# Patient Record
Sex: Female | Born: 1965 | Race: White | Hispanic: No | Marital: Married | Smoking: Never smoker
Health system: Southern US, Community
[De-identification: ages and names within clinical notes are randomized; demographics above are authoritative.]

---

## 2012-04-09 DIAGNOSIS — F419 Anxiety disorder, unspecified: Secondary | ICD-10-CM | POA: Insufficient documentation

## 2014-08-29 DIAGNOSIS — G4700 Insomnia, unspecified: Secondary | ICD-10-CM | POA: Insufficient documentation

## 2014-09-12 DIAGNOSIS — F325 Major depressive disorder, single episode, in full remission: Secondary | ICD-10-CM | POA: Insufficient documentation

## 2015-08-21 ENCOUNTER — Ambulatory Visit (INDEPENDENT_AMBULATORY_CARE_PROVIDER_SITE_OTHER): Payer: BC Managed Care – PPO | Admitting: Family Medicine

## 2015-08-21 ENCOUNTER — Encounter: Payer: Self-pay | Admitting: Family Medicine

## 2015-08-21 VITALS — BP 110/75 | HR 68 | Ht 69.0 in | Wt 192.0 lb

## 2015-08-21 DIAGNOSIS — M25511 Pain in right shoulder: Secondary | ICD-10-CM | POA: Insufficient documentation

## 2015-08-21 NOTE — Progress Notes (Signed)
PCP: Dr. Victorino DikeJennifer Shah  Subjective:   HPI: Patient is a 50 y.o. female here for right shoulder pain.  Patient reports for past 2 months she's had right shoulder pain. Pain is lateral, sharp and worse when lying on this side. Pain currently 0/10. 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 No prior issues. Right handed. No skin changes, numbness.  No past medical history on file.  No current outpatient prescriptions on file prior to visit.   No current facility-administered medications on file prior to visit.    No past surgical history on file.  No Known Allergies  Social History   Social History  . Marital Status: Married    Spouse Name: N/A  . Number of Children: N/A  . Years of Education: N/A   Occupational History  . Not on file.   Social History Main Topics  . Smoking status: Never Smoker   . Smokeless tobacco: Not on file  . Alcohol Use: Not on file  . Drug Use: Not on file  . Sexual Activity: Not on file   Other Topics Concern  . Not on file   Social History Narrative  . No narrative on file    No family history on file.  BP 110/75 mmHg  Pulse 68  Ht 5\' 9"  (1.753 m)  Wt 192 lb (87.091 kg)  BMI 28.34 kg/m2  Review of Systems: See HPI above.    Objective:  Physical Exam:  Gen: NAD, comfortable in exam room  Right shoulder: No swelling, ecchymoses.  No gross deformity. No TTP. FROM with painful arc. Positive Hawkins, negative Neers. Negative Speeds, Yergasons. Strength 5/5 with empty can and resisted internal/external rotation.  Very mild pain with empty can and ER Negative apprehension. NV intact distally.  Left shoulder: FROM without pain.    Assessment & Plan:  1. Right shoulder pain - 2/2 rotator cuff impingement.  Discussed nsaids.  Shown home exercises to do daily.  Consider injection, physical therapy, nitro patches, imaging if not improving.  F/u in 6 weeks.

## 2015-08-21 NOTE — Patient Instructions (Signed)
You have rotator cuff impingement Try to avoid painful activities (overhead activities, lifting with extended arm) as much as possible. Aleve 2 tabs twice a day with food OR ibuprofen 3 tabs three times a day with food for pain and inflammation for 7-10 days then as needed. Can take tylenol in addition to this. Subacromial injection may be beneficial to help with pain and to decrease inflammation. Consider physical therapy with transition to home exercise program. Do home exercise program with theraband and scapular stabilization exercises daily - these are very important for long term relief even if an injection was given.  3 sets of 10 once a day. If not improving at follow-up we will consider further imaging, injection, physical therapy, and/or nitro patches. Call me if you want to do any of the above before I see you back in 6 weeks. Follow up in 6 weeks.

## 2015-08-21 NOTE — Assessment & Plan Note (Signed)
2/2 rotator cuff impingement.  Discussed nsaids.  Shown home exercises to do daily.  Consider injection, physical therapy, nitro patches, imaging if not improving.  F/u in 6 weeks.

## 2015-10-04 ENCOUNTER — Encounter: Payer: Self-pay | Admitting: Family Medicine

## 2015-10-04 ENCOUNTER — Ambulatory Visit (INDEPENDENT_AMBULATORY_CARE_PROVIDER_SITE_OTHER): Payer: BC Managed Care – PPO | Admitting: Family Medicine

## 2015-10-04 DIAGNOSIS — M25511 Pain in right shoulder: Secondary | ICD-10-CM | POA: Diagnosis not present

## 2015-10-09 NOTE — Assessment & Plan Note (Signed)
2/2 rotator cuff impingement.  Much improved with home exercises.  Encouraged to continue these for 4-6 more weeks.  Ibuprofen if needed.  F/u prn.

## 2015-10-09 NOTE — Progress Notes (Signed)
PCP: Dr. Victorino DikeJennifer Shah  Subjective:   HPI: Patient is a 50 y.o. female here for right shoulder pain.  7/3: Patient reports for past 2 months she's had right shoulder pain. Pain is lateral, sharp and worse when lying on this side. Pain currently 0/10. 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 No prior issues. Right handed. No skin changes, numbness.  8/16: Patient reports she's doing much better. Improved about 75% from last visit. Pain level 0/10. Doing home exercises. No skin changes, numbness.  No past medical history on file.  Current Outpatient Prescriptions on File Prior to Visit  Medication Sig Dispense Refill  . buPROPion (WELLBUTRIN XL) 150 MG 24 hr tablet TAKE ONE TABLET (150 MG TOTAL) BY MOUTH DAILY.  3  . zolpidem (AMBIEN) 5 MG tablet TAKE 1/2 OF A TABLET BY MOUTH NIGHTLY AT BEDTIME AS NEEDED FOR SLEEP  0   No current facility-administered medications on file prior to visit.     No past surgical history on file.  No Known Allergies  Social History   Social History  . Marital status: Married    Spouse name: N/A  . Number of children: N/A  . Years of education: N/A   Occupational History  . Not on file.   Social History Main Topics  . Smoking status: Never Smoker  . Smokeless tobacco: Never Used  . Alcohol use Not on file  . Drug use: Unknown  . Sexual activity: Not on file   Other Topics Concern  . Not on file   Social History Narrative  . No narrative on file    No family history on file.  BP 122/85   Pulse 60   Ht 5\' 9"  (1.753 m)   Wt 187 lb (84.8 kg)   BMI 27.62 kg/m   Review of Systems: See HPI above.    Objective:  Physical Exam:  Gen: NAD, comfortable in exam room  Right shoulder: No swelling, ecchymoses.  No gross deformity. No TTP. FROM with negative painful arc. Negative Hawkins, negative Neers. Negative Speeds, Yergasons. Strength 5/5 with empty can and resisted  internal/external rotation.  No pain currently. Negative apprehension. NV intact distally.  Left shoulder: FROM without pain.    Assessment & Plan:  1. Right shoulder pain - 2/2 rotator cuff impingement.  Much improved with home exercises.  Encouraged to continue these for 4-6 more weeks.  Ibuprofen if needed.  F/u prn.

## 2016-01-15 DIAGNOSIS — E663 Overweight: Secondary | ICD-10-CM | POA: Insufficient documentation

## 2016-04-16 ENCOUNTER — Encounter: Payer: Self-pay | Admitting: Family Medicine

## 2016-04-16 ENCOUNTER — Encounter (INDEPENDENT_AMBULATORY_CARE_PROVIDER_SITE_OTHER): Payer: Self-pay

## 2016-04-16 ENCOUNTER — Ambulatory Visit (HOSPITAL_BASED_OUTPATIENT_CLINIC_OR_DEPARTMENT_OTHER)
Admission: RE | Admit: 2016-04-16 | Discharge: 2016-04-16 | Disposition: A | Discharge status: Home / Self Care | Payer: BC Managed Care – PPO | Source: Ambulatory Visit | Attending: Family Medicine | Admitting: Family Medicine

## 2016-04-16 ENCOUNTER — Ambulatory Visit (INDEPENDENT_AMBULATORY_CARE_PROVIDER_SITE_OTHER): Payer: BC Managed Care – PPO | Admitting: Family Medicine

## 2016-04-16 VITALS — BP 109/71 | HR 61 | Ht 69.0 in | Wt 186.0 lb

## 2016-04-16 DIAGNOSIS — S99911A Unspecified injury of right ankle, initial encounter: Secondary | ICD-10-CM | POA: Diagnosis not present

## 2016-04-16 DIAGNOSIS — X58XXXA Exposure to other specified factors, initial encounter: Secondary | ICD-10-CM | POA: Diagnosis not present

## 2016-04-16 DIAGNOSIS — M7989 Other specified soft tissue disorders: Secondary | ICD-10-CM | POA: Insufficient documentation

## 2016-04-16 NOTE — Patient Instructions (Signed)
You have an ankle sprain. Ice the area for 15 minutes at a time, 3-4 times a day Aleve 2 tabs twice a day with food OR ibuprofen 3 tabs three times a day with food for pain and inflammation - typically take for 7-10 days regularly then as needed. Elevate above the level of your heart when possible Use laceup ankle brace to help with stability while you recover from this injury. Come out of the brace twice a day to do Up/down and alphabet exercises 2-3 sets of each. Start theraband strengthening exercises - once a day 3 sets of 10. Consider physical therapy for strengthening and balance exercises. If not improving as expected, we may repeat x-rays or consider further testing like an MRI. Follow up with me in 4 weeks for reevaluation.

## 2016-04-17 DIAGNOSIS — S99911D Unspecified injury of right ankle, subsequent encounter: Secondary | ICD-10-CM | POA: Insufficient documentation

## 2016-04-17 NOTE — Progress Notes (Signed)
PCP: No PCP Per Patient  Subjective:   HPI: Patient is a 51 y.o. female here for right ankle injury.  Patient reports 2 weeks ago she accidentally stepped on a rock and rolled her right ankle. Has continued to have swelling, soreness laterally. Pain level is 2/10 and sore. Worse at nighttime, at end of the day. Ok with walking. Iced initially and taking ibuprofen occasionally. No skin changes, numbness otherwise.  No past medical history on file.  Current Outpatient Prescriptions on File Prior to Visit  Medication Sig Dispense Refill  . buPROPion (WELLBUTRIN XL) 150 MG 24 hr tablet TAKE ONE TABLET (150 MG TOTAL) BY MOUTH DAILY.  3  . zolpidem (AMBIEN) 5 MG tablet TAKE 1/2 OF A TABLET BY MOUTH NIGHTLY AT BEDTIME AS NEEDED FOR SLEEP  0   No current facility-administered medications on file prior to visit.     No past surgical history on file.  No Known Allergies  Social History   Social History  . Marital status: Married    Spouse name: N/A  . Number of children: N/A  . Years of education: N/A   Occupational History  . Not on file.   Social History Main Topics  . Smoking status: Never Smoker  . Smokeless tobacco: Never Used  . Alcohol use Not on file  . Drug use: Unknown  . Sexual activity: Not on file   Other Topics Concern  . Not on file   Social History Narrative  . No narrative on file    No family history on file.  BP 109/71   Pulse 61   Ht 5\' 9"  (1.753 m)   Wt 186 lb (84.4 kg)   BMI 27.47 kg/m   Review of Systems: See HPI above.     Objective:  Physical Exam:  Gen: NAD, comfortable in exam room  Right ankle: Mod lateral swelling.  No other gross deformity,  ecchymoses FROM TTP lateral malleolus, ATFL, peroneal tendons.  No other tenderness of ankle. 1+ ant drawer and negative talar tilt.   Negative syndesmotic compression. Thompsons test negative. NV intact distally.  Left ankle: FROM without pain.   MSK u/s:  No cortical  irregularity, edema overlying cortex of lateral malleolus.  Talus appears normal.  Peroneal tendons intact as well.  Assessment & Plan:  1. Right ankle injury - independently reviewed radiographs and no evidence fracture.  Ultrasound also performed and reviewed without tendon injury, occult fracture.  Consistent with lateral sprain.  Icing, aleve or ibuprofen.  Elevation.  ASO for stability.  Shown home exercises to do daily.  F/u in 4 weeks.

## 2016-04-17 NOTE — Assessment & Plan Note (Signed)
independently reviewed radiographs and no evidence fracture.  Ultrasound also performed and reviewed without tendon injury, occult fracture.  Consistent with lateral sprain.  Icing, aleve or ibuprofen.  Elevation.  ASO for stability.  Shown home exercises to do daily.  F/u in 4 weeks.

## 2016-05-20 ENCOUNTER — Ambulatory Visit (INDEPENDENT_AMBULATORY_CARE_PROVIDER_SITE_OTHER): Payer: BC Managed Care – PPO | Admitting: Family Medicine

## 2016-05-20 ENCOUNTER — Encounter: Payer: Self-pay | Admitting: Family Medicine

## 2016-05-20 DIAGNOSIS — S99911D Unspecified injury of right ankle, subsequent encounter: Secondary | ICD-10-CM | POA: Diagnosis not present

## 2016-05-20 NOTE — Patient Instructions (Signed)
I would wear the ankle brace for a couple more weeks with long walking and on irregular surfaces then discontinue this. Do ankle theraband exercises 3 sets of 10 once a day for at least 2 more weeks. Call me if you have any problems otherwise follow up as needed.

## 2016-05-22 NOTE — Assessment & Plan Note (Signed)
Radiographs negative.  Ultrasound reassuring.  2/2 sprain.  Doing well clinically.  Wear ASO for 2 more weeks with irregular surfaces and long walking only.  Continue HEP for 2 more weeks.  Tylenol, icing, aleve only if needed.  F/u prn.

## 2016-05-22 NOTE — Progress Notes (Signed)
PCP: [facility]  Subjective:   HPI: Patient is a 51 y.o. female here for right ankle injury.  2/27: Patient reports 2 weeks ago she accidentally stepped on a rock and rolled her right ankle. Has continued to have swelling, soreness laterally. Pain level is 2/10 and sore. Worse at nighttime, at end of the day. Ok with walking. Iced initially and taking ibuprofen occasionally. No skin changes, numbness otherwise.  4/2: Patient reports she feels about 80% better. Only slight swelling. Stopped taking aleve recently when she ran out. Using ASO and doing home exercises. Pain is 0/10 at rest, up to 2/10 at worst. Feels like this gets tired more often and does have to change shoes. No skin changes, numbness.  No past medical history on file.  Current Outpatient Prescriptions on File Prior to Visit  Medication Sig Dispense Refill  . buPROPion (WELLBUTRIN XL) 150 MG 24 hr tablet TAKE ONE TABLET (150 MG TOTAL) BY MOUTH DAILY.  3  . zolpidem (AMBIEN) 5 MG tablet TAKE 1/2 OF A TABLET BY MOUTH NIGHTLY AT BEDTIME AS NEEDED FOR SLEEP  0   No current facility-administered medications on file prior to visit.     No past surgical history on file.  No Known Allergies  Social History   Social History  . Marital status: Married    Spouse name: N/A  . Number of children: N/A  . Years of education: N/A   Occupational History  . Not on file.   Social History Main Topics  . Smoking status: Never Smoker  . Smokeless tobacco: Never Used  . Alcohol use Not on file  . Drug use: Unknown  . Sexual activity: Not on file   Other Topics Concern  . Not on file   Social History Narrative  . No narrative on file    No family history on file.  BP 114/76   Pulse 64   Ht  (1.753 m)   Wt 187 lb (84.8 kg)   BMI 27.62 kg/m   Review of Systems: See HPI above.     Objective:  Physical Exam:  Gen: NAD, comfortable in exam room  Right ankle: Mild  lateral swelling.  No other gross deformity,  ecchymoses FROM Minimal TTP ATFL.  No other tenderness. Trace ant drawer and negative talar tilt.   Negative syndesmotic compression. Thompsons test negative. NV intact distally.  Left ankle: FROM without pain.   Assessment & Plan:  1. Right ankle injury - Radiographs negative.  Ultrasound reassuring.  2/2 sprain.  Doing well clinically.  Wear ASO for 2 more weeks with irregular surfaces and long walking only.  Continue HEP for 2 more weeks.  Tylenol, icing, aleve only if needed.  F/u prn.

## 2017-09-10 ENCOUNTER — Ambulatory Visit: Payer: BC Managed Care – PPO | Admitting: Family Medicine

## 2017-09-10 ENCOUNTER — Encounter: Payer: Self-pay | Admitting: Family Medicine

## 2017-09-10 VITALS — BP 110/60 | Ht 69.0 in | Wt 190.0 lb

## 2017-09-10 DIAGNOSIS — M545 Low back pain, unspecified: Secondary | ICD-10-CM

## 2017-09-10 MED ORDER — METHOCARBAMOL 500 MG PO TABS: 500.0000 mg | ORAL_TABLET | Freq: Three times a day (TID) | ORAL | 1 refills | Status: AC | PRN

## 2017-09-10 MED ORDER — MELOXICAM 15 MG PO TABS: 15.0000 mg | ORAL_TABLET | Freq: Every day | ORAL | 2 refills | Status: AC

## 2017-09-10 NOTE — Progress Notes (Signed)
PCP: [facility]  Subjective:   HPI: Patient is a [age] y.o. female here for 5/10 right-sided low back pain for the last 3 days.  Patient localizes the pain to the lumbosacral area.  The pain occurred following no specific mechanism of injury.  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  She specifically noted the pain several hours following cleaning bathroom floor.  She states that the pain is worse with flexion or lying in bed.  She states it is difficult to sleep and find a position of comfort.  Her pain is relieved as long as she maintains good upright posture.  She has gotten mild benefit with Motrin.  She states that the muscle in her back feel tight.  She denies any radiating pain or radicular symptoms.  She denies any recent illness, fevers, chills, bowel or bladder symptoms or saddle anesthesia.   History reviewed. No pertinent past medical history.  Current Outpatient Medications on File Prior to Visit  Medication Sig Dispense Refill  . buPROPion (WELLBUTRIN XL) 150 MG 24 hr tablet TAKE ONE TABLET (150 MG TOTAL) BY MOUTH DAILY.  3  . zolpidem (AMBIEN) 5 MG tablet TAKE 1/2 OF A TABLET BY MOUTH NIGHTLY AT BEDTIME AS NEEDED FOR SLEEP  0   No current facility-administered medications on file prior to visit.     History reviewed. No pertinent surgical history.  No Known Allergies  Social History   Socioeconomic History  . Marital status: Married    Spouse name: Not on file  . Number of children: Not on file  . Years of education: Not on file  . Highest education level: Not on file  Occupational History  . Not on file  Social Needs  . Financial resource strain: Not on file  . Food insecurity:    Worry: Not on file    Inability: Not on file  . Transportation needs:    Medical: Not on file    Non-medical: Not on file  Tobacco Use  . Smoking status: Never Smoker  .  Smokeless tobacco: Never Used  Substance and Sexual Activity  . Alcohol use: Not on file  . Drug use: Not on file  . Sexual activity: Not on file  Lifestyle  . Physical activity:    Days per week: Not on file    Minutes per session: Not on file  . Stress: Not on file  Relationships  . Social connections:    Talks on phone: Not on file    Gets together: Not on file    Attends religious service: Not on file    Active member of club or organization: Not on file    Attends meetings of clubs or organizations: Not on file    Relationship status: Not on file  . Intimate partner violence:    Fear of current or ex partner: Not on file    Emotionally abused: Not on file    Physically abused: Not on file    Forced sexual activity: Not on file  Other Topics Concern  . Not on file  Social History Narrative  . Not on file    History reviewed. No pertinent family history.  BP 110/60   Ht 5\' 9"  (1.753 m)   Wt 190 lb (86.2 kg)   BMI 28.06 kg/m   Review of Systems: See HPI above.     Objective:  Physical Exam:  Gen: NAD, comfortable in exam room  Lumbar spine: - Inspection: no gross deformity or asymmetry noted - Palpation: No TTP over the spinous processes.  Mild tenderness to palpation over the right lumbar paraspinals and QL - ROM: Decreased range of motion in flexion noted with pain.  Normal movement in rotation and extension.  Patient reports feeling of mild pain in tightness with rotational movement - Strength: 5/5 strength of lower extremity in L4-S1 nerve root distributions b/l - Neuro: NV intact in bilateral distal lower extremity - Special testing: Negative straight leg raise  Bilateral hips: No deformity. FROM with 5/5 strength. No tenderness to palpation. NVI distally. Negative logrolls.  Assessment & Plan:  1.  Acute right-sided low back pain without radiculopathy.  3 days of right-sided muscular back pain.  No concerning findings on exam or red flag  symptoms. -Patient given home exercise program for strengthening and stretching of low back. -May use heat for pain relief. -We will start on Robaxin 500 mg 3 times daily as needed.  Discussed risk of drowsiness with this medication especially with concomitant use of Ambien. -Ordered Mobic 15 mg daily -Follow-up in 4-6 weeks if pain continues

## 2017-09-10 NOTE — Assessment & Plan Note (Signed)
3 days of right-sided muscular back pain.  No concerning findings on exam or red flag symptoms. -Patient given home exercise program for strengthening and stretching of low back. -May use heat for pain relief. -We will start on Robaxin 500 mg 3 times daily as needed.  Discussed risk of drowsiness with this medication especially with concomitant use of Ambien. -Ordered Mobic 15 mg daily -Follow-up in 4-6 weeks if pain continues

## 2017-09-10 NOTE — Patient Instructions (Signed)
You have a lumbar strain. Meloxicam 15mg  daily with food for pain and inflammation- take for 7 days then as needed. Robaxin as needed for muscle spasms. Stay as active as possible. Do home exercises and stretches as directed - hold each for 20-30 seconds and do each one three times. Follow up with me in 1 month or as needed if you're doing well.

## 2018-02-03 IMAGING — DX DG ANKLE COMPLETE 3+V*R*
3 series · 3 of 3 positions shown · non-contrast
Comparison: None.

CLINICAL DATA: Right ankle injury 2 weeks ago with persistent pain
and swelling, initial encounter

EXAM:
RIGHT ANKLE - COMPLETE 3+ VIEW

[ankle ap]
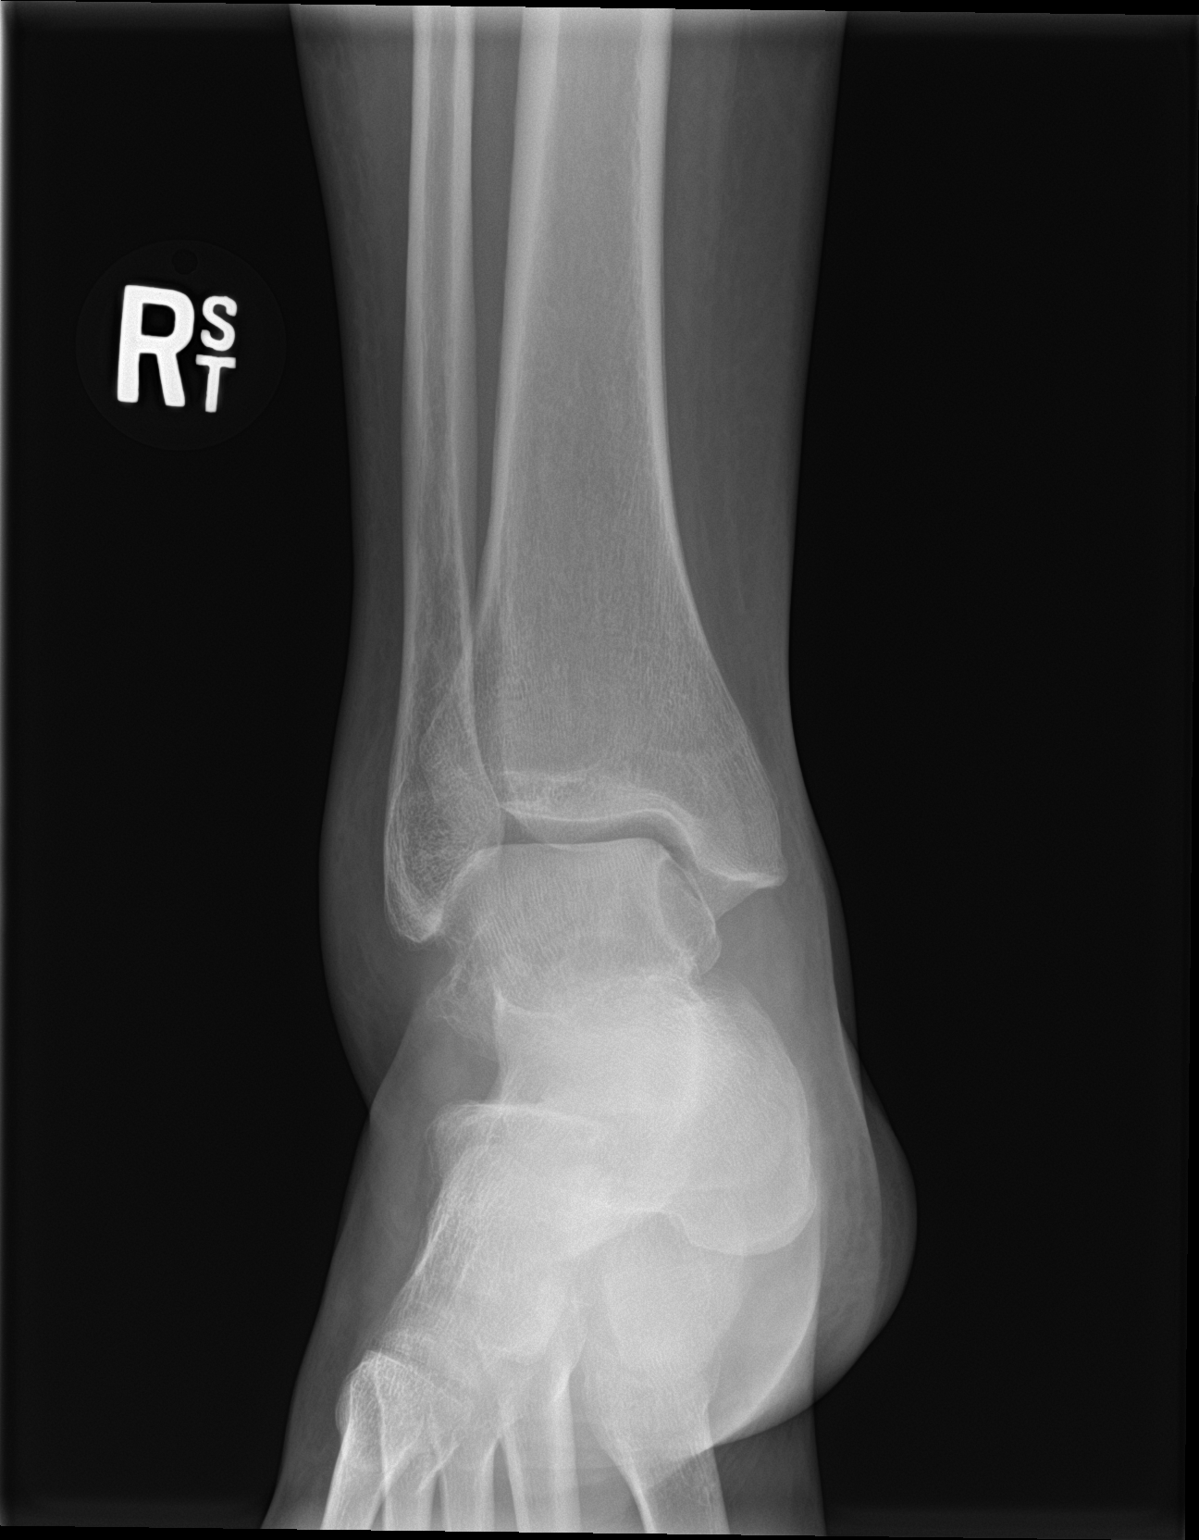

[ankle obl]
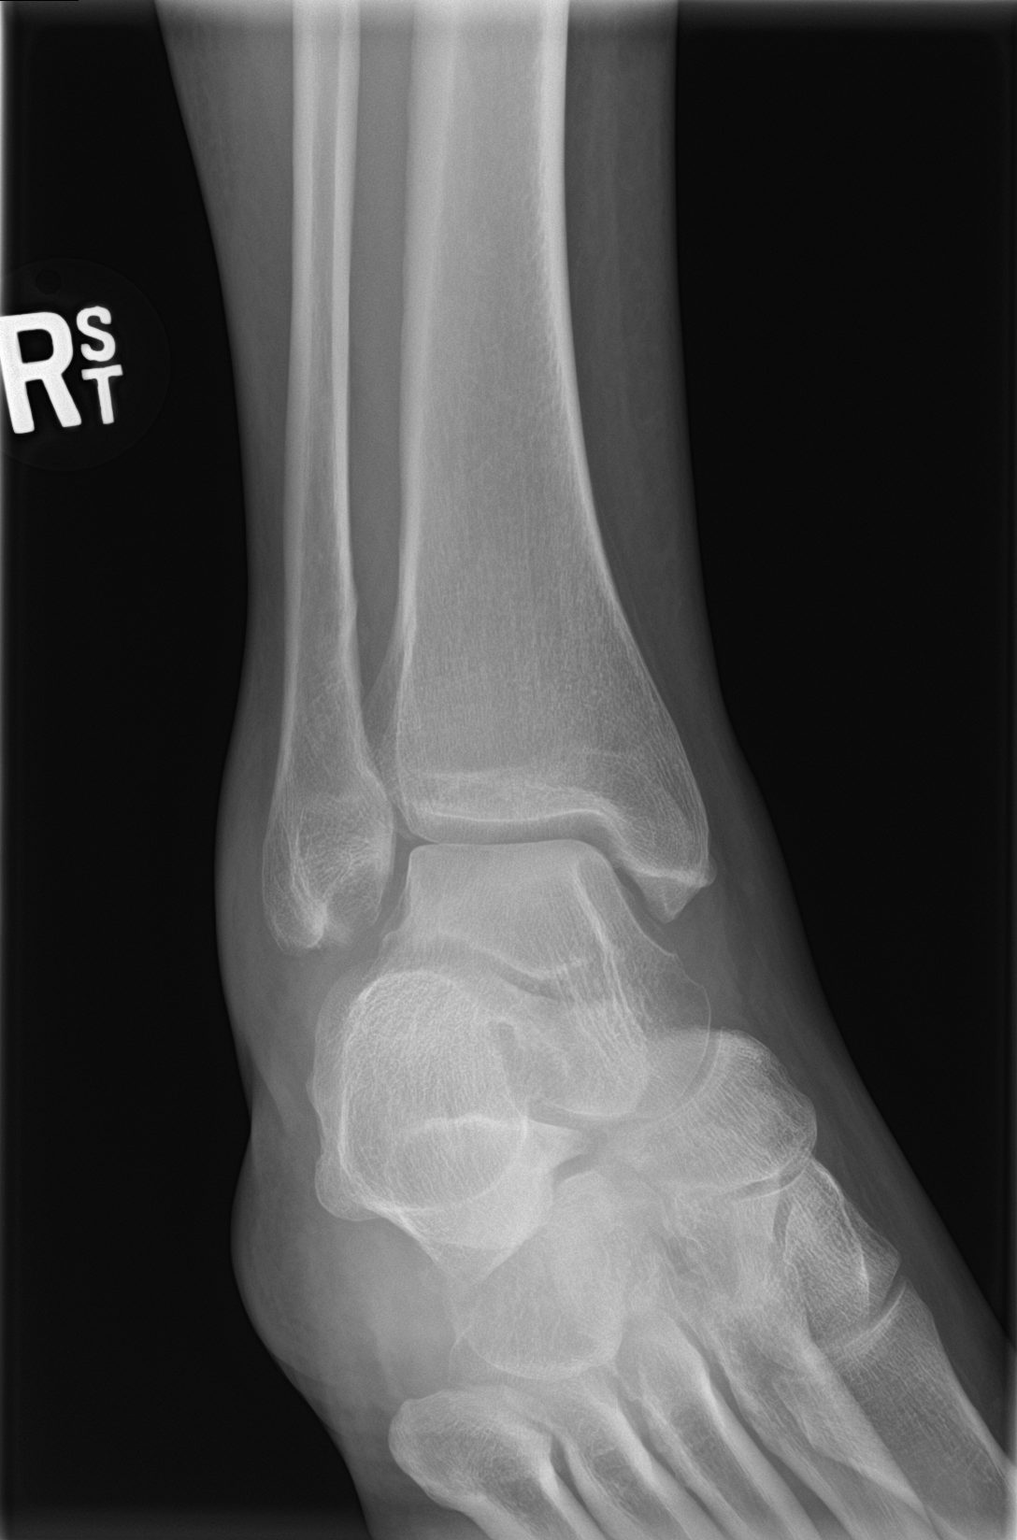

[ankle lat]
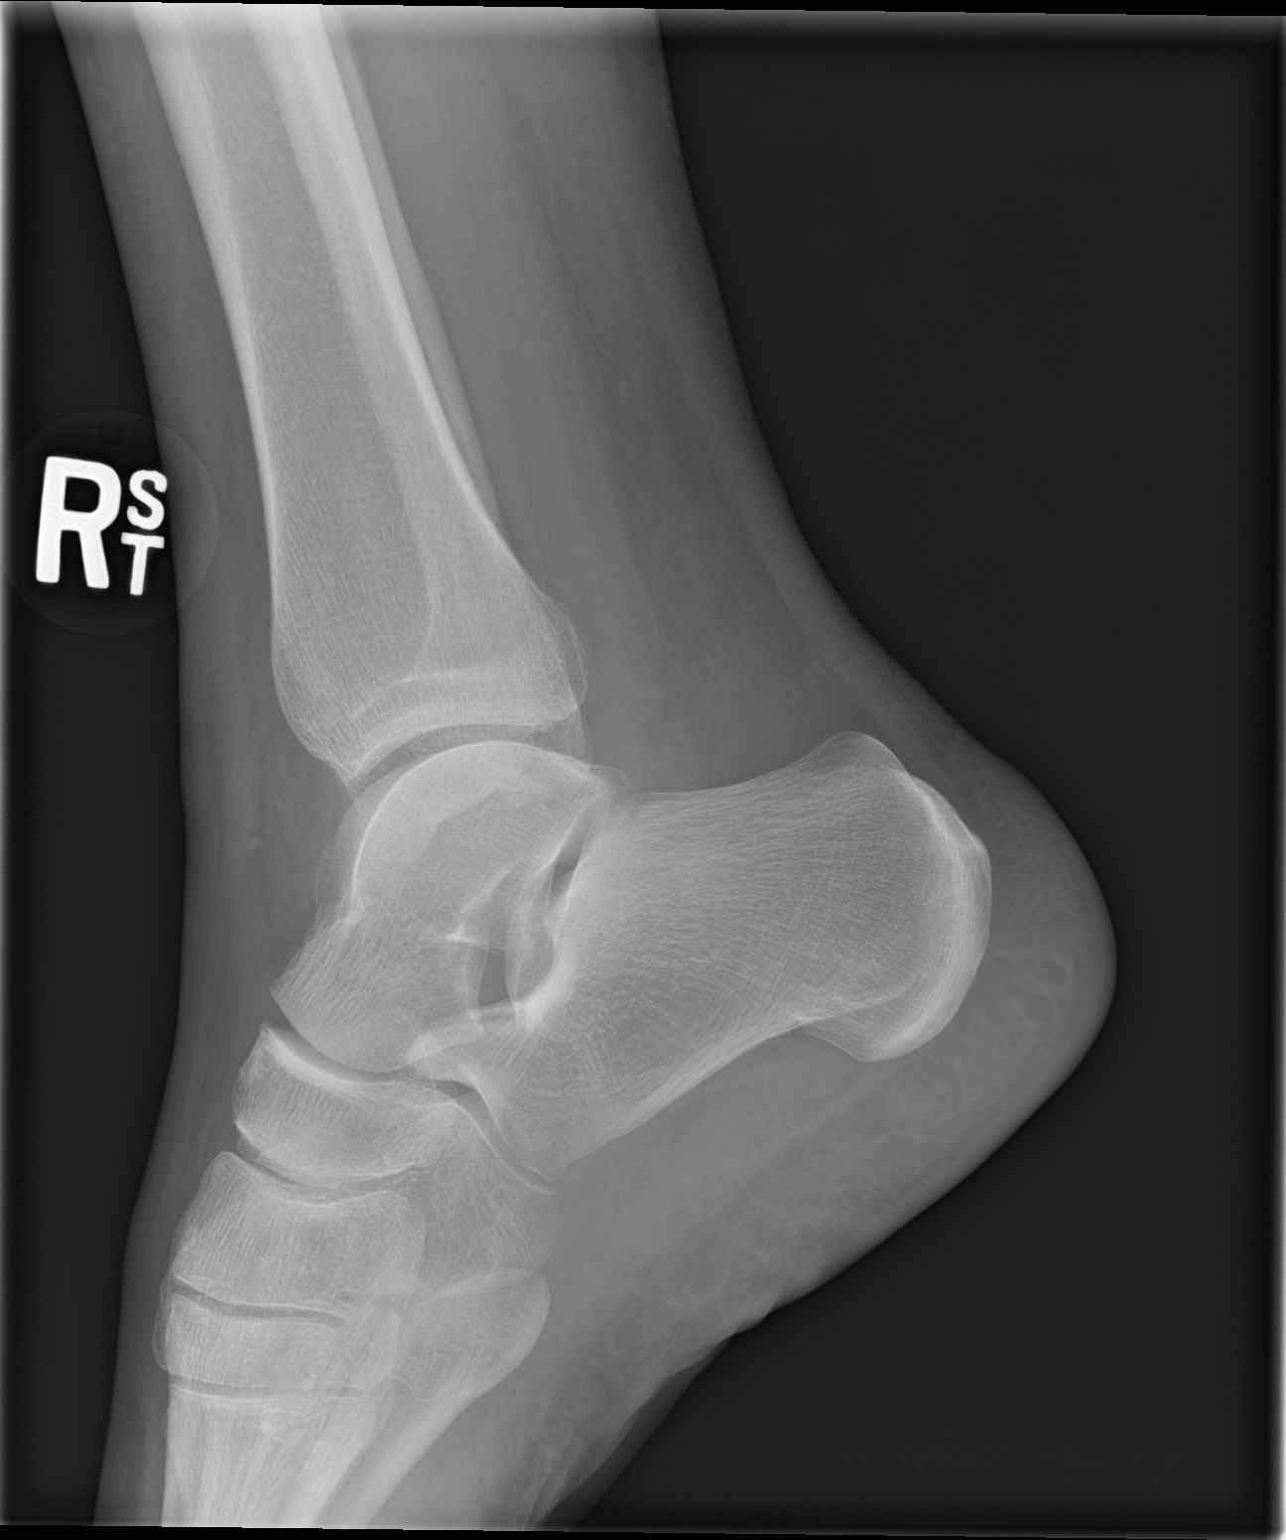

[3 of 3 positions shown; findings below may reference images not displayed]

FINDINGS: Lateral soft tissue swelling is seen. No acute fracture or
dislocation is noted.
IMPRESSION: Soft tissue swelling without acute bony abnormality.

## 2019-04-15 ENCOUNTER — Other Ambulatory Visit: Payer: Self-pay

## 2019-04-15 ENCOUNTER — Ambulatory Visit: Payer: BC Managed Care – PPO | Attending: Internal Medicine

## 2019-04-15 DIAGNOSIS — Z23 Encounter for immunization: Secondary | ICD-10-CM | POA: Insufficient documentation

## 2019-04-15 NOTE — Progress Notes (Signed)
   Covid-19 Vaccination Clinic  Name:  Journi Moffa    MRN: [id number] DOB: July 23, 1965  04/15/2019  Ms. Vink was observed post Covid-19 immunization for 15 minutes without incidence. She was provided with Vaccine Information Sheet and instruction to access the V-Safe system.   Ms. Criss was instructed to call 911 with any severe reactions post vaccine: Marland Kitchen Difficulty breathing  . Swelling of your face and throat  . A fast heartbeat  . A bad rash all over your body  . Dizziness and weakness    Immunizations Administered    Name Date Dose VIS Date Route   Moderna COVID-19 Vaccine 04/15/2019 12:06 PM 0.5 mL 01/19/2019 Intramuscular   Manufacturer: Moderna   Lot: [id number]   NDC: 79150-569-79

## 2019-05-18 ENCOUNTER — Ambulatory Visit: Payer: BC Managed Care – PPO | Attending: Family

## 2019-05-18 DIAGNOSIS — Z23 Encounter for immunization: Secondary | ICD-10-CM

## 2019-05-18 NOTE — Progress Notes (Signed)
   Covid-19 Vaccination Clinic  Name:  Madison Shah    MRN: [id number] DOB: [birth year]  05/18/2019  [name] was observed post Covid-19 immunization for 15 minutes without incident. She was provided with Vaccine Information Sheet and instruction to access the V-Safe system.   [name] was instructed to call 911 with any severe reactions post vaccine: Marland Kitchen Difficulty breathing  . Swelling of face and throat  . A fast heartbeat  . A bad rash all over body  . Dizziness and weakness   Immunizations Administered    Name Date Dose VIS Date Route   Moderna COVID-19 Vaccine 05/18/2019  9:08 AM 0.5 mL 01/19/2019 Intramuscular   Manufacturer: Moderna   Lot: [id number]   NDC: 86168-372-90

## 2021-08-29 ENCOUNTER — Encounter (HOSPITAL_BASED_OUTPATIENT_CLINIC_OR_DEPARTMENT_OTHER): Payer: Self-pay | Admitting: Emergency Medicine

## 2021-08-29 ENCOUNTER — Emergency Department (HOSPITAL_BASED_OUTPATIENT_CLINIC_OR_DEPARTMENT_OTHER)
Admission: EM | Admit: 2021-08-29 | Discharge: 2021-08-29 | Discharge status: Against Medical Advice | Payer: BC Managed Care – PPO | Attending: Emergency Medicine | Admitting: Emergency Medicine

## 2021-08-29 ENCOUNTER — Other Ambulatory Visit: Payer: Self-pay

## 2021-08-29 DIAGNOSIS — Z5321 Procedure and treatment not carried out due to patient leaving prior to being seen by health care provider: Secondary | ICD-10-CM | POA: Insufficient documentation

## 2021-08-29 DIAGNOSIS — R1013 Epigastric pain: Secondary | ICD-10-CM | POA: Diagnosis present

## 2021-08-29 LAB — CBC WITH DIFFERENTIAL/PLATELET
Abs Immature Granulocytes: 0.02 10*3/uL (ref 0.00–0.07)
Basophils Absolute: 0 10*3/uL (ref 0.0–0.1)
Basophils Relative: 1 %
Eosinophils Absolute: 0.1 10*3/uL (ref 0.0–0.5)
Eosinophils Relative: 1 %
HCT: 41.1 % (ref 36.0–46.0)
Hemoglobin: 14.5 g/dL (ref 12.0–15.0)
Immature Granulocytes: 0 %
Lymphocytes Relative: 25 %
Lymphs Abs: 2 10*3/uL (ref 0.7–4.0)
MCH: 30.9 pg (ref 26.0–34.0)
MCHC: 35.3 g/dL (ref 30.0–36.0)
MCV: 87.4 fL (ref 80.0–100.0)
Monocytes Absolute: 0.5 10*3/uL (ref 0.1–1.0)
Monocytes Relative: 6 %
Neutro Abs: 5.4 10*3/uL (ref 1.7–7.7)
Neutrophils Relative %: 67 %
Platelets: 281 10*3/uL (ref 150–400)
RBC: 4.7 MIL/uL (ref 3.87–5.11)
RDW: 13.6 % (ref 11.5–15.5)
WBC: 8 10*3/uL (ref 4.0–10.5)
nRBC: 0 % (ref 0.0–0.2)

## 2021-08-29 LAB — URINALYSIS, ROUTINE W REFLEX MICROSCOPIC
Bilirubin Urine: NEGATIVE
Glucose, UA: NEGATIVE mg/dL
Ketones, ur: 15 mg/dL — AB
Leukocytes,Ua: NEGATIVE
Nitrite: NEGATIVE
Protein, ur: NEGATIVE mg/dL
Specific Gravity, Urine: 1.02 (ref 1.005–1.030)
pH: 6 (ref 5.0–8.0)

## 2021-08-29 LAB — COMPREHENSIVE METABOLIC PANEL
ALT: 40 U/L (ref 0–44)
AST: 36 U/L (ref 15–41)
Albumin: 4.4 g/dL (ref 3.5–5.0)
Alkaline Phosphatase: 61 U/L (ref 38–126)
Anion gap: 10 (ref 5–15)
BUN: 20 mg/dL (ref 6–20)
CO2: 22 mmol/L (ref 22–32)
Calcium: 9.2 mg/dL (ref 8.9–10.3)
Chloride: 103 mmol/L (ref 98–111)
Creatinine, Ser: 0.71 mg/dL (ref 0.44–1.00)
GFR, Estimated: >60 mL/min (ref >60)
Glucose, Bld: 110 mg/dL — ABNORMAL HIGH (ref 70–99)
Potassium: 4 mmol/L (ref 3.5–5.1)
Sodium: 135 mmol/L (ref 135–145)
Total Bilirubin: 0.8 mg/dL (ref 0.3–1.2)
Total Protein: 7.7 g/dL (ref 6.5–8.1)

## 2021-08-29 LAB — PREGNANCY, URINE: Preg Test, Ur: NEGATIVE

## 2021-08-29 LAB — URINALYSIS, MICROSCOPIC (REFLEX): WBC, UA: NONE SEEN WBC/hpf (ref 0–5)

## 2021-08-29 LAB — TROPONIN I (HIGH SENSITIVITY): Troponin I (High Sensitivity): <2 ng/L (ref <18)

## 2021-08-29 LAB — LIPASE, BLOOD: Lipase: 48 U/L (ref 11–51)

## 2021-08-29 NOTE — ED Triage Notes (Signed)
Pt c/o intermittent epigastric abdominal pain that radiates to back that started today. Describes pain as "squeezing" Unrelieved with pepto. Denies n/v, shob.

## 2023-03-03 ENCOUNTER — Ambulatory Visit (HOSPITAL_BASED_OUTPATIENT_CLINIC_OR_DEPARTMENT_OTHER)
Admission: RE | Admit: 2023-03-03 | Discharge: 2023-03-03 | Disposition: A | Discharge status: Home / Self Care | Payer: 59 | Source: Ambulatory Visit | Attending: Family Medicine | Admitting: Family Medicine

## 2023-03-03 ENCOUNTER — Ambulatory Visit: Payer: 59 | Admitting: Family Medicine

## 2023-03-03 VITALS — BP 120/84 | Ht 69.0 in | Wt 195.0 lb

## 2023-03-03 DIAGNOSIS — M25551 Pain in right hip: Secondary | ICD-10-CM | POA: Diagnosis present

## 2023-03-03 NOTE — Progress Notes (Signed)
 CHIEF COMPLAINT: No chief complaint on file.  _____________________________________________________________ SUBJECTIVE  HPI  Pt is a 58 y.o. female here for evaluation of lower back pain  Ongoing for about 2 weeks Inciting event: christmas evening driving down to [place] . Next day that afternoon back started to hurt a little bit. Does think mattress contributed to it. Pain moved to the hip. Then drove back. Felt the pain was coming around to her thigh. Has been using ibuprofen. Thigh pain would come about at night. 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 States pain would get so bad she felt she couldn't walk properly. Later started to notice some numbness, lateral thigh. Is now feeling that all the time. In the morning it hurts, but when getting moving will be okay. Then later afternoon/evening leg gets tired, has a lot of pain. Puts heat on it. Will notice when going up stairs she can't lift up her R leg bc worried it won't hold up properly. Is wondering if she has a pinched nerve. Lateral numbness stays above the knee. Denies radicular symptoms. Back part of the pain isn't here anymore, only sometimes  ------------------------------------------------------------------------------------------------------ No past medical history on file.  No past surgical history on file.    Outpatient Encounter Medications as of 03/03/2023  Medication Sig Note   buPROPion (WELLBUTRIN XL) 150 MG 24 hr tablet TAKE ONE TABLET (150 MG TOTAL) BY MOUTH DAILY. 08/21/2015: Received from: External Pharmacy   Cholecalciferol (VITAMIN D) 2000 units tablet Take by mouth.    meloxicam  (MOBIC ) 15 MG tablet Take 1 tablet (15 mg total) by mouth daily.    methocarbamol  (ROBAXIN ) 500 MG tablet Take 1 tablet (500 mg total) by mouth every 8 (eight) hours as needed.    RESTASIS 0.05 % ophthalmic emulsion INSTILL 1 DROP INTO BOTH EYES TWICE A  DAY AS DIRECTED    zolpidem (AMBIEN) 5 MG tablet TAKE 1/2 OF A TABLET BY MOUTH NIGHTLY AT BEDTIME AS NEEDED FOR SLEEP 08/21/2015: Received from: External Pharmacy   No facility-administered encounter medications on file as of 03/03/2023.    ------------------------------------------------------------------------------------------------------  _____________________________________________________________ OBJECTIVE  PHYSICAL EXAM  Today's Vitals   03/03/23 0929  BP: 120/84  Weight: 195 lb (88.5 kg)  Height: 5' 9 (1.753 m)   Body mass index is 28.8 kg/m.   reviewed  General: A+Ox3, no acute distress, well-nourished, appropriate affect CV: pulses 2+ regular, nondiaphoretic, no peripheral edema, cap refill <2sec Lungs: no audible wheezing, non-labored breathing, bilateral chest rise/fall, nontachypneic Skin: warm, well-perfused, non-icteric, no susp lesions or rashes Neuro:  Sensation intact, muscle tone wnl, no atrophy Psych: no signs of depression or anxiety MSK:  No L spine tenderness, NTTP SIJ B/l trendenlenberg+ Hip: No deformity, swelling or wasting ROM Flexion 90, ext 30, IR 45, ER 45 NTTP over the hip flexors, adductors, inguinal canal, greater troch, glute musculature Weakness resisted hip flexion limited by pain SLR negative, pain along lateral quad region elicited Some pain with resisted abduction/adduction Negative log roll  Negative FABER Negative FADIR Negative scour test  _____________________________________________________________ ASSESSMENT/PLAN Diagnoses and all orders for this visit:  Acute pain of right hip -     DG Lumbar Spine Complete; Future -     DG HIP UNILAT W OR W/O PELVIS 2-3 VIEWS RIGHT; Future -     Ambulatory referral to Physical Therapy  2 week history of lower back/hip pain, few days of  lateral hip numbness. Films ordered today, additional imaging pending XR findings. Lateral hip numbness etiology unclear, differential including  meralgia paresthetica, localized impingement, radicular pathology. PT referral placed today. Anticipate follow-up in a few weeks, sooner pending XR results. All questions answered. Return precautions discussed. Patient verbalized understanding and is in agreement with plan  Electronically signed by: Benedict LELON Bumps, MD 03/03/2023 8:50 AM

## 2023-03-18 ENCOUNTER — Encounter: Payer: Self-pay | Admitting: Family Medicine

## 2023-03-18 ENCOUNTER — Ambulatory Visit: Payer: 59 | Admitting: Family Medicine

## 2023-03-18 VITALS — BP 120/80 | Ht 69.0 in | Wt 195.0 lb

## 2023-03-18 DIAGNOSIS — R200 Anesthesia of skin: Secondary | ICD-10-CM

## 2023-03-18 DIAGNOSIS — M25551 Pain in right hip: Secondary | ICD-10-CM

## 2023-03-18 NOTE — Progress Notes (Signed)
CHIEF COMPLAINT: No chief complaint on file.  _____________________________________________________________ SUBJECTIVE  HPI  Pt is a 58 y.o. female here for Follow-up of right hip/lower back pain  Last seen 03/03/2023, PT referral placed with HEP exercises provided  Shares that she has been feeling better. Thigh is however still numb. Cold does bothers it No longer with weakness that she had previously. Pain severity also reduced  currently on ibuprofen dosage to 800mg  but not taking as often, takes it only at night. 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  No past surgical history on file.    Outpatient Encounter Medications as of 03/18/2023  Medication Sig Note   buPROPion (WELLBUTRIN XL) 150 MG 24 hr tablet TAKE ONE TABLET (150 MG TOTAL) BY MOUTH DAILY. 08/21/2015: Received from: External Pharmacy   Cholecalciferol (VITAMIN D) 2000 units tablet Take by mouth.    meloxicam (MOBIC) 15 MG tablet Take 1 tablet (15 mg total) by mouth daily.    methocarbamol (ROBAXIN) 500 MG tablet Take 1 tablet (500 mg total) by mouth every 8 (eight) hours as needed.    RESTASIS 0.05 % ophthalmic emulsion INSTILL 1 DROP INTO BOTH EYES TWICE A DAY AS DIRECTED    zolpidem (AMBIEN) 5 MG tablet TAKE 1/2 OF A TABLET BY MOUTH NIGHTLY AT BEDTIME AS NEEDED FOR SLEEP 08/21/2015: Received from: External Pharmacy   No facility-administered  encounter medications on file as of 03/18/2023.    ------------------------------------------------------------------------------------------------------  _____________________________________________________________ OBJECTIVE  PHYSICAL EXAM  Today's Vitals   03/18/23 1547  BP: 120/80  Weight: 195 lb (88.5 kg)  Height: 5\' 9"  (1.753 m)   Body mass index is 28.8 kg/m.   reviewed  General: A+Ox3, no acute distress, well-nourished, appropriate affect CV: pulses 2+ regular, nondiaphoretic, no peripheral edema, cap refill <2sec Lungs: no audible wheezing, non-labored breathing, bilateral chest rise/fall, nontachypneic Skin: warm, well-perfused, non-icteric, no susp lesions or rashes Neuro:  Sensation intact, muscle tone wnl, no atrophy Psych: no signs of depression or anxiety MSK:  No midline tenderness.  Some lumbar paraspinous muscle hypertonicity.  Right SI joint asymmetric, lower than the left, with correlative findings of elevated ASIS landmark on the right.  Positive Trendelenburg.  Nontender SIJ, greater trochanter, hip flexor, adductor, inguinal canal.  There is quad numbness spanning 13 cm starting 3-4 cm just proximal to the midline patella seeming to follow rectus femoris distribution, has sensation at the hip flexor origin, no paresthesias or reduced sensation over anterolateral thigh distribution that would correlate more closely with meralgia paresthetica.  Stinchfield testing negative, scour negative, logroll test negative.  FABER/FADIR negative, IR 45, ER 30.  Strength bilaterally intact with resisted hip abduction/abduction.  Ober testing negative.  Quad tightness present.  _____________________________________________________________ ASSESSMENT/PLAN Diagnoses and all orders for this visit:  Acute right hip pain  Leg numbness -     Nerve conduction test; Future   X-ray findings reviewed with patient.  Reassuring exam today with respect to the right hip weakness, which  has been improving notably with home exercises/work on hip joint  ROM.  Patient plans to establish with physical therapy still, which is very reasonable.  Initial pain may have been femoroacetabular in origin causing transient weakness.  Quad numbness unclear source, possible lumbar etiology, however this does not usually follow current distribution of sensory deficit identified/described today.  Discussed options for further management.  Would continue with HEP/PT exercises, ordering NCV testing to further identify sensory deficit. Jonay plans to implement this testing in a few months time; discussed appropriate to return to clinic to re-evaluate if symptoms worsen, persist, or new concerns arise. Testing also available to schedule sooner per patient preference. Continue to work with primary care provider to rule out non muscular etiologies. Anticipate f/u PRN. sooner as needed. All questions answered. Return precautions discussed. Patient verbalized understanding and is in agreement with plan  Electronically signed by: Burna Forts, MD 03/18/2023 12:31 PM
